# Patient Record
Sex: Male | Born: 1995 | Race: White | Hispanic: Yes | Marital: Single | Smoking: Current every day smoker
Health system: Southern US, Community
[De-identification: ages and names within clinical notes are randomized; demographics above are authoritative.]

---
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
## 2020-01-23 ENCOUNTER — Other Ambulatory Visit: Payer: Self-pay | Admitting: Family Medicine

## 2020-01-23 ENCOUNTER — Encounter: Payer: Self-pay | Admitting: Emergency Medicine

## 2020-01-23 ENCOUNTER — Ambulatory Visit
Admission: EM | Admit: 2020-01-23 | Discharge: 2020-01-23 | Disposition: A | Discharge status: Home / Self Care | Payer: 59 | Attending: Family Medicine | Admitting: Family Medicine

## 2020-01-23 ENCOUNTER — Other Ambulatory Visit: Payer: Self-pay

## 2020-01-23 DIAGNOSIS — H60503 Unspecified acute noninfective otitis externa, bilateral: Secondary | ICD-10-CM

## 2020-01-23 MED ORDER — CIPROFLOXACIN-DEXAMETHASONE 0.3-0.1 % OT SUSP: 4.0000 [drp] | Freq: Two times a day (BID) | OTIC | 0 refills | Status: AC

## 2020-01-23 MED ORDER — KETOROLAC TROMETHAMINE 10 MG PO TABS: 10.0000 mg | ORAL_TABLET | Freq: Four times a day (QID) | ORAL | 0 refills | Status: AC | PRN

## 2020-01-23 MED ORDER — CIPROFLOXACIN HCL 500 MG PO TABS
500.0000 mg | ORAL_TABLET | Freq: Two times a day (BID) | ORAL | 0 refills | Status: AC
Start: 2020-01-23 — End: 2020-01-30

## 2020-01-23 NOTE — ED Provider Notes (Signed)
[facility]    CSN: [id number] Arrival date & time: 01/23/20  1214  History   Chief Complaint Chief Complaint  Patient presents with  . Otalgia    right   HPI  24 year old male presents with the above complaint.  Patient reports that his symptoms started yesterday.  He reports right ear pain.  He was seen by his primary care physician and was prescribed ofloxacin drops.  He states that he has had no improvement.  Reports severe right ear pain.  10/10 in severity.  Interfered with sleep last night.  Patient states that he has had this condition a few times since moving.  Denies recent swimming.  No fever or respiratory symptoms.  No other complaints at this time.  Home Medications    Prior to Admission medications   Medication Sig Start Date End Date Taking? Authorizing Provider  ofloxacin (FLOXIN) 0.3 % OTIC solution  01/22/20  Yes [provider]  ciprofloxacin (CIPRO) 500 MG tablet Take 1 tablet (500 mg total) by mouth 2 (two) times daily for 7 days. 01/23/20 01/30/20  Tommie Sams, DO  ciprofloxacin-dexamethasone (CIPRODEX) OTIC suspension Place 4 drops into both ears 2 (two) times daily for 7 days. 01/23/20 01/30/20  Tommie Sams, DO  ketorolac (TORADOL) 10 MG tablet Take 1 tablet (10 mg total) by mouth every 6 (six) hours as needed for moderate pain or severe pain. 01/23/20   Tommie Sams, DO    Family History Family History  Problem Relation Age of Onset  . Healthy Mother   . Healthy Father     Social History Social History   Tobacco Use  . Smoking status: Current Every Day Smoker    Types: Cigarettes  . Smokeless tobacco: Never Used  Vaping Use  . Vaping Use: Never used  Substance Use Topics  . Alcohol use: Yes  . Drug use: Never     Allergies   Patient has no known allergies.   Review of Systems Review of Systems  Constitutional: Negative.   HENT: Positive for ear pain.    Physical Exam Triage Vital Signs ED Triage Vitals  Enc  Vitals Group     BP 01/23/20 1229 (!) 135/86     Pulse Rate 01/23/20 1229 93     Resp 01/23/20 1229 16     Temp 01/23/20 1229 98.1 F (36.7 C)     Temp Source 01/23/20 1229 Oral     SpO2 01/23/20 1229 99 %     Weight 01/23/20 1226 (!) 271 lb (122.9 kg)     Height 01/23/20 1226 6\' 2"  (1.88 m)     Head Circumference --      Peak Flow --      Pain Score 01/23/20 1226 10     Pain Loc --      Pain Edu? --      Excl. in GC? --    Updated Vital Signs BP (!) 135/86 (BP Location: Right Arm)   Pulse 93   Temp 98.1 F (36.7 C) (Oral)   Resp 16   Ht 6\' 2"  (1.88 m)   Wt (!) 122.9 kg   SpO2 99%   BMI 34.79 kg/m   Visual Acuity Right Eye Distance:   Left Eye Distance:   Bilateral Distance:    Right Eye Near:   Left Eye Near:    Bilateral Near:     Physical Exam Vitals and nursing note reviewed.  Constitutional:  General: He is not in acute distress.    Appearance: Normal appearance. He is not ill-appearing.  HENT:     Head: Normocephalic and atraumatic.     Ears:     Comments: Left ear -canal with white debris and mild erythema.  Right ear -mild tragal tenderness.  Canal edematous.  Debris noted. Eyes:     General:        Right eye: No discharge.        Left eye: No discharge.     Conjunctiva/sclera: Conjunctivae normal.  Cardiovascular:     Rate and Rhythm: Normal rate and regular rhythm.     Heart sounds: No murmur heard.   Pulmonary:     Effort: Pulmonary effort is normal.     Breath sounds: Normal breath sounds.  Neurological:     Mental Status: He is alert.  Psychiatric:        Mood and Affect: Mood normal.        Behavior: Behavior normal.    UC Treatments / Results  Labs (all labs ordered are listed, but only abnormal results are displayed) Labs Reviewed - No data to display  EKG   Radiology No results found.  Procedures Procedures (including critical care time)  Medications Ordered in UC Medications - No data to display  Initial  Impression / Assessment and Plan / UC Course  I have reviewed the triage vital signs and the nursing notes.  Pertinent labs & imaging results that were available during my care of the patient were reviewed by me and considered in my medical decision making (see chart for details).    23 year old male presents with acute and worsening otitis externa.  Treating with Ciprodex as well as oral ciprofloxacin.  Toradol for pain.  Advised to call primary care physician for referral given recurrent episodes of otitis externa.  Final Clinical Impressions(s) / UC Diagnoses   Final diagnoses:  Acute otitis externa of both ears, unspecified type     Discharge Instructions     Medication as prescribed.  Call PCP for referral to ENT.   Take care  Dr. Adriana Simas    ED Prescriptions    Medication Sig Dispense Auth. Provider   ciprofloxacin-dexamethasone (CIPRODEX) OTIC suspension Place 4 drops into both ears 2 (two) times daily for 7 days. 7.5 mL Kyair Ditommaso G, DO   ciprofloxacin (CIPRO) 500 MG tablet Take 1 tablet (500 mg total) by mouth 2 (two) times daily for 7 days. 14 tablet Napolean Sia G, DO   ketorolac (TORADOL) 10 MG tablet Take 1 tablet (10 mg total) by mouth every 6 (six) hours as needed for moderate pain or severe pain. 20 tablet Tommie Sams, DO     PDMP not reviewed this encounter.   Tommie Sams, [place] 01/23/20 1353

## 2020-01-23 NOTE — Discharge Instructions (Signed)
Medication as prescribed.  Call PCP for referral to ENT.   Take care  Dr. Adriana Simas

## 2020-01-23 NOTE — ED Triage Notes (Signed)
Patient c/o right ear pain that started yesterday.  Patient states that he saw a doctor yesterday and was prescribed ear drops. Patient denies fevers.
